# Patient Record
Sex: Male | Born: 1945 | Race: Asian | Hispanic: No
Health system: Midwestern US, Academic
[De-identification: ages and names within clinical notes are randomized; demographics above are authoritative.]

---

## 2016-12-04 ENCOUNTER — Encounter: Admit: 2016-12-04 | Discharge: 2016-12-04 | Payer: MEDICARE

## 2016-12-04 ENCOUNTER — Emergency Department: Admit: 2016-12-04 | Discharge: 2016-12-04 | Disposition: A | Discharge status: Home / Self Care | Payer: MEDICARE

## 2016-12-04 DIAGNOSIS — M7661 Achilles tendinitis, right leg: Principal | ICD-10-CM

## 2016-12-04 DIAGNOSIS — I456 Pre-excitation syndrome: Principal | ICD-10-CM

## 2016-12-04 NOTE — ED Provider Notes
William Sanchez is a 71 y.o. male.    Chief Complaint:  Chief Complaint   Patient presents with   ??? Generalized Joint/Body Pain     tendonitis in right achiles; ruptured left achiles 1989       History of Present Illness:  William Sanchez is a 71 y.o. male, with a history of  Wolff-Parkinson-White (WPW) syndrome who presents to the emergency department with wife at bedside for ankle pain. Pt reports onset 2 days ago with localization around the right Achilles tendon and intermittent recurrence. Pt reports a significant amount of dancing and walking on uneven surfaces in the last few days. Pt notes previous rupture to his left Achilles tendon. Pt has self-treated with ice, massage, and ibuprofen with some relief. Pt is currently taking Losartan. He denies any other rx or recent abx.            Review of Systems:  Review of Systems   Constitutional: Negative for chills and fever.   Respiratory: Negative for shortness of breath.    Cardiovascular: Negative for chest pain and leg swelling.   Gastrointestinal: Negative for abdominal pain and nausea.   Genitourinary: Negative.    Musculoskeletal: Negative for arthralgias, joint swelling and myalgias.        Pain right achilles   Skin: Positive for color change.        Mild redness over affected area   Neurological: Negative for dizziness, weakness and numbness.   Hematological: Does not bruise/bleed easily.       Allergies:  Penicillin g    Past Medical History:  Past Medical History:   Diagnosis Date   ??? Wolff-Parkinson-White (WPW) syndrome        Past Surgical History:  Past Surgical History:   Procedure Laterality Date   ??? ABLATION LESION         Pertinent medical/surgical history reviewed  Past Medical History:   Diagnosis Date   ??? Wolff-Parkinson-White (WPW) syndrome      Past Surgical History:   Procedure Laterality Date   ??? ABLATION LESION         Social History:  Social History   Substance Use Topics   ??? Smoking status: Not on file ??? Smokeless tobacco: Not on file   ??? Alcohol use Not on file     History   Drug use: Unknown       Family History:  No family history on file.    Vitals:  ED Vitals    Date and Time T BP P RR SPO2P SPO2 User   12/04/16 1203 36.4 ???C (97.5 ???F) 157/74 -- 12 PER MINUTE 68 98 % JH          Physical Exam:  Physical Exam   Constitutional: He is oriented to person, place, and time. He appears well-developed and well-nourished.   Eyes: Conjunctivae are normal.   Neck: Normal range of motion.   Cardiovascular: Normal rate, regular rhythm, normal heart sounds and intact distal pulses.    Pulses:       Posterior tibial pulses are 2+ on the right side, and 2+ on the left side.   Pulmonary/Chest: Effort normal and breath sounds normal.   Abdominal: Soft. Bowel sounds are normal. There is no tenderness.   Musculoskeletal: Normal range of motion. He exhibits tenderness (mid right achilles tendon). He exhibits no edema.        Right ankle: He exhibits no swelling.   Negative Thompson right achilles  Neurological: He is alert and oriented to person, place, and time.   RLE neurovascularly intact   Skin: Capillary refill takes less than 2 seconds.   Psychiatric: He has a normal mood and affect.   Nursing note and vitals reviewed.      Laboratory Results:  No results found for this visit on 12/04/16 (from the past 24 hour(s)).   N/A    Radiology Interpretation:    N/A    EKG:    N/A    ED Course:  Physical exam significant for mild erythema, TTP, negative Thompson right achilles.  No labs or imaging indicated  Medications: none  Currently patient is without c/o pain. Pain controlled with Ibuprofen   Discussed outpatient management and follow up with PCP upon returning home to [place].  All questions fully answered. Pt agreeable to plan.   Return to ED precautions given.   Dismissed in stable condition.     MDM  Reviewed: nursing note and vitals        Facility Administered Meds: No current facility-administered medications on file as of 12/04/2016.          Clinical Impression:  Final diagnoses:   Tendonitis, Achilles, right (Primary)       Disposition/Follow up  Discharge          As needed      Medications:  New Prescriptions    No medications on file       Procedure Notes:  Procedures      Attestation / Supervision:  I, Vidal Schwalbe, am scribing for and in the presence of Zenovia Jarred, NP.      Vidal Schwalbe    Attestation / Supervision Note concerning Cathrine Muster: The service was provided by the ARNP alone with immediate availability of a physician in the ED. and I, Zenovia Jarred, APRN-NP, personally performed the services described in this documentation as scribed in my presence and it is both accurate and complete.    Zenovia Jarred, APRN-NP

## 2016-12-04 NOTE — ED Notes
Discharge paperwork provided and explained, pt acknowledged understanding.

## 2016-12-04 NOTE — ED Notes
71yo male presents to the ED with c/o pain in the L achilles tendon area. Pt denies any injury, states "I walked on a lot of uneven ground lately. Pt reports pain when walking, hard to put weight on foot. No obvious deformity noted, pt denies any numbness/tingling. Pt is AOx4, in cart, waiting provider eval.

## 2018-07-05 IMAGING — MR MRI KNEE LT WO CONTRAST
5 series · 40 of 40 positions shown · IV contrast (gadolinium)
Comparison: None

HISTORY: Gout
TECHNIQUE: Multiplanar and multisequence MR imaging of the left knee was performed without the administration of intravenous gadolinium.

[Series 2: t2_axial_fs · axial · 4.0mm · 0.27mm/px · z∈[-92,+33]mm · 8 of 26 slices shown]
[im 1/26]
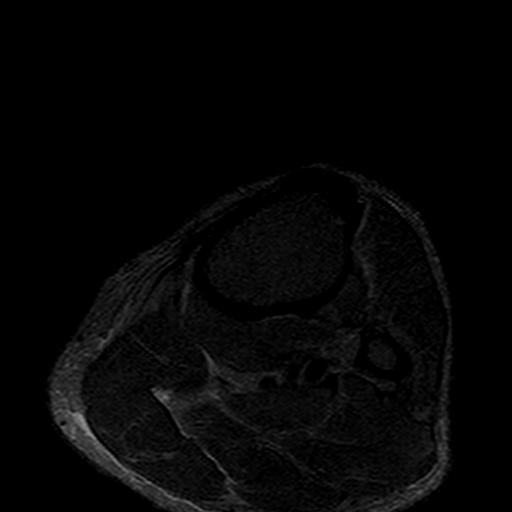
[im 4/26]
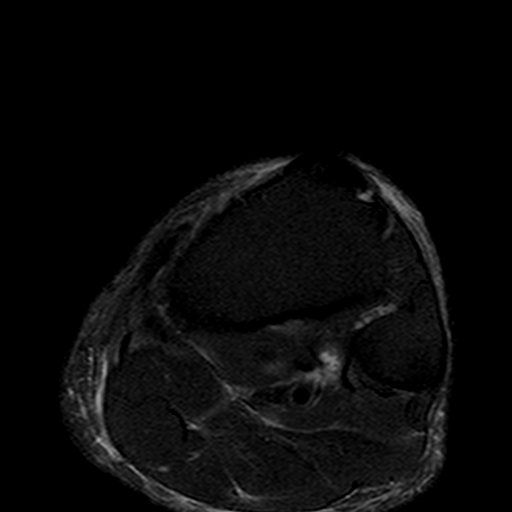
[im 8/26]
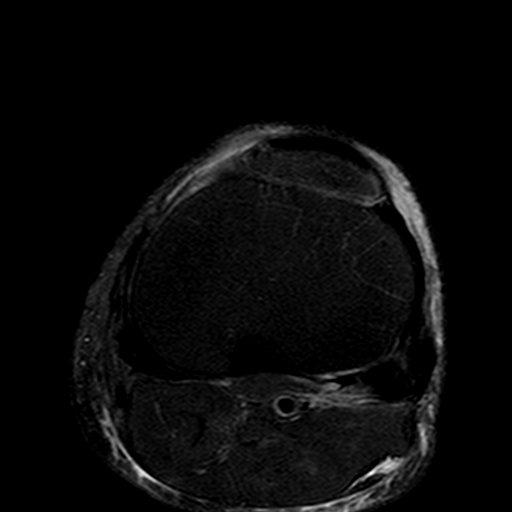
[im 11/26]
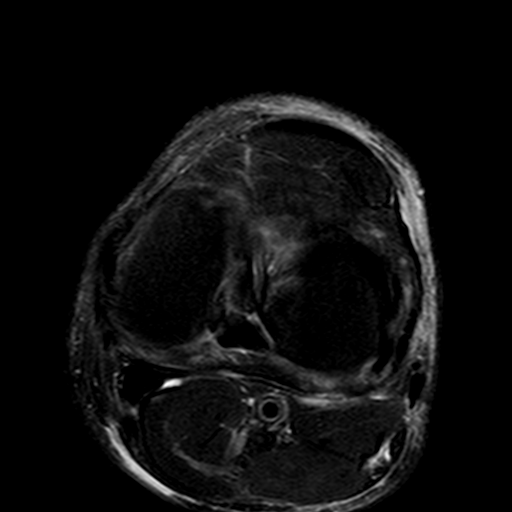
[im 15/26]
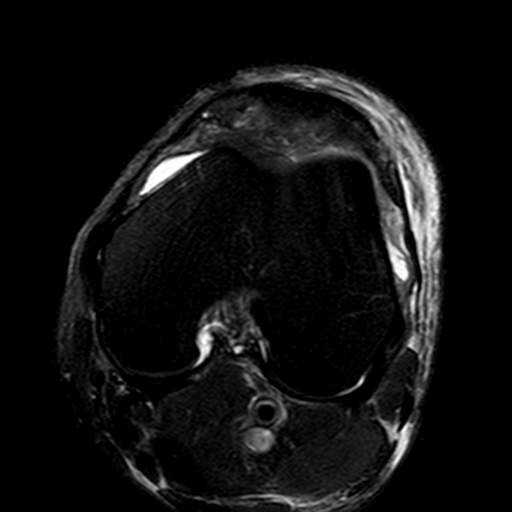
[im 18/26]
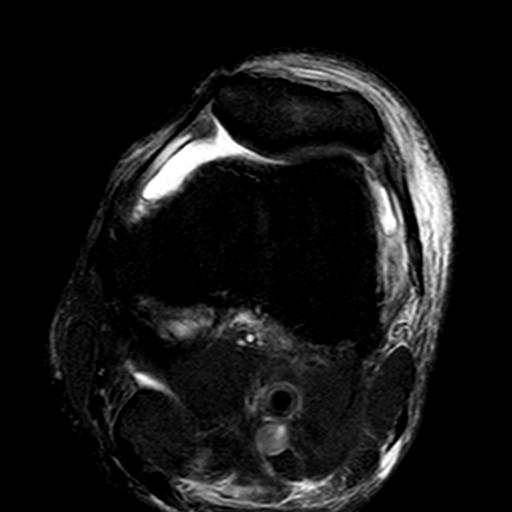
[im 22/26]
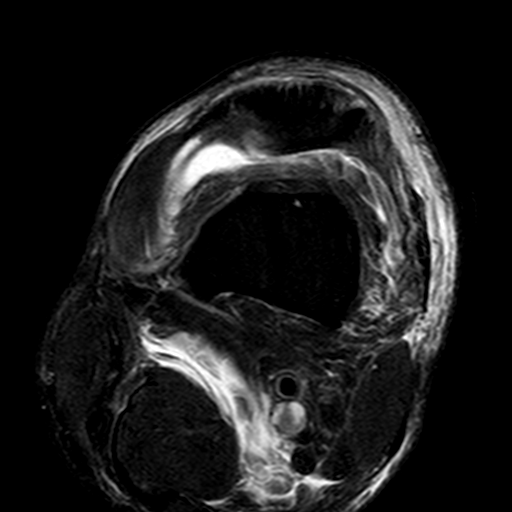
[im 26/26]
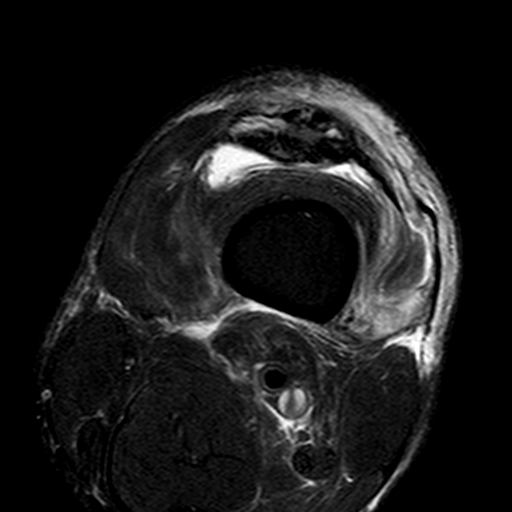

[Series 4: pd_sag_fs · sagittal · 3.0mm · 0.64mm/px · 8 of 28 slices shown]
[im 1/28]
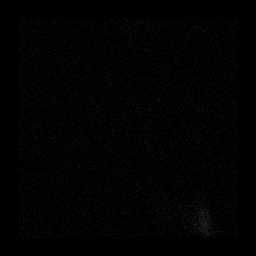
[im 4/28]
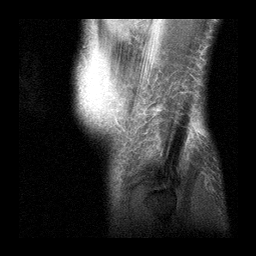
[im 8/28]
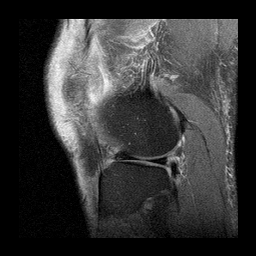
[im 12/28]
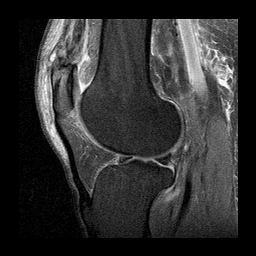
[im 16/28]
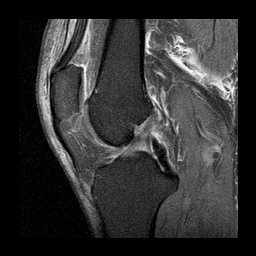
[im 20/28]
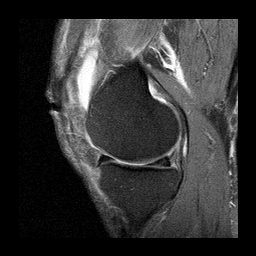
[im 24/28]
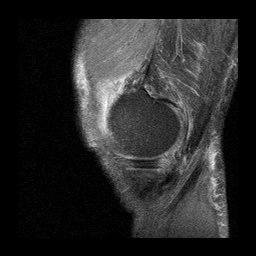
[im 28/28]
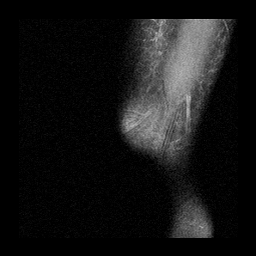

[Series 5: t2_sag_fs · sagittal · 3.0mm · 0.64mm/px · 8 of 28 slices shown]
[im 1/28]
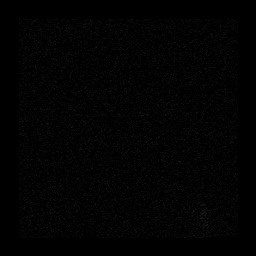
[im 4/28]
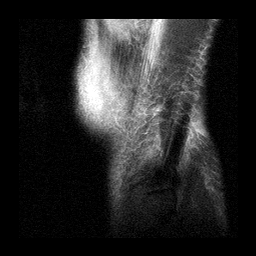
[im 8/28]
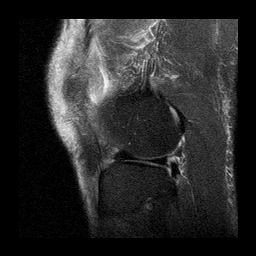
[im 12/28]
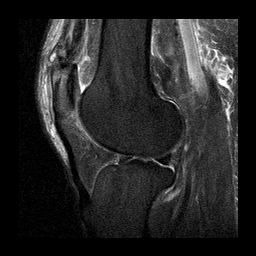
[im 16/28]
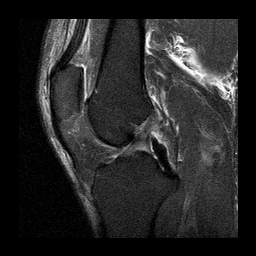
[im 20/28]
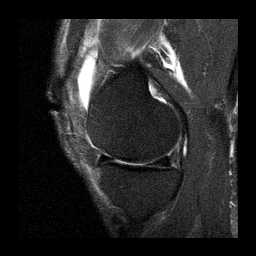
[im 24/28]
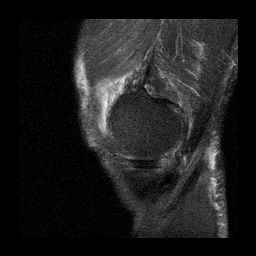
[im 28/28]
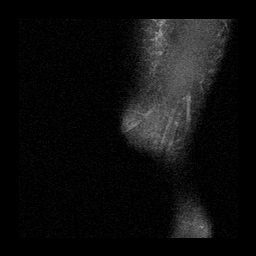

[Series 6: t1_cor · coronal · 4.0mm · 0.42mm/px · 8 of 26 slices shown]
[im 1/26]
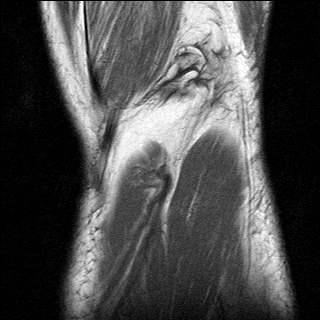
[im 4/26]
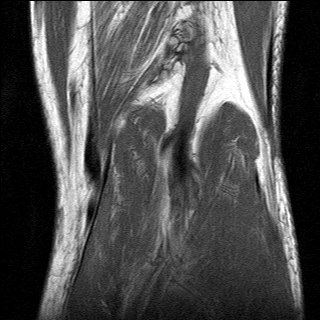
[im 8/26]
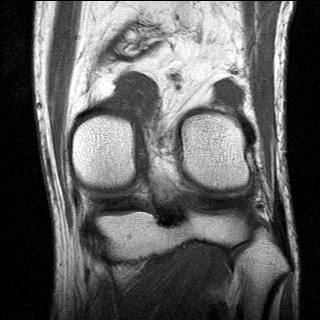
[im 11/26]
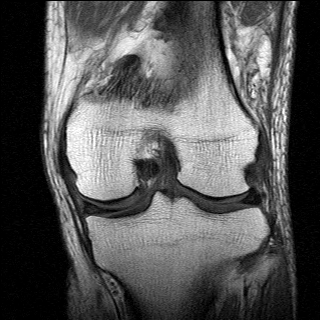
[im 15/26]
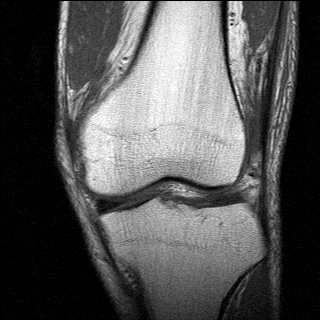
[im 18/26]
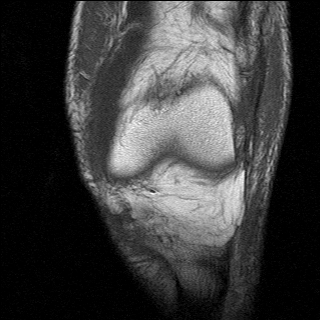
[im 22/26]
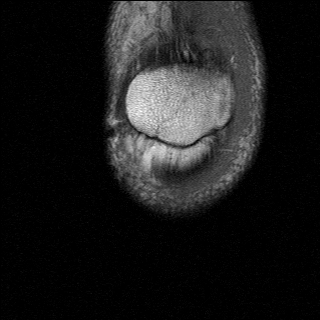
[im 26/26]
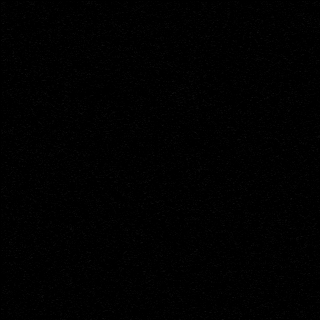

[Series 7: t2_cor_fs · coronal · 4.0mm · 0.53mm/px · 8 of 26 slices shown]
[im 1/26]
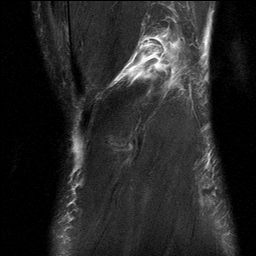
[im 4/26]
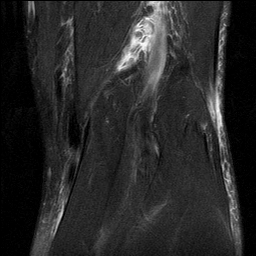
[im 8/26]
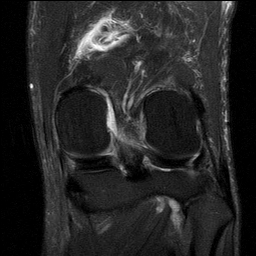
[im 11/26]
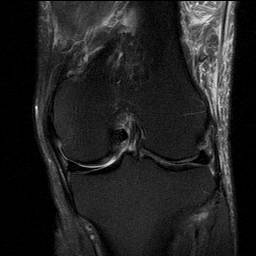
[im 15/26]
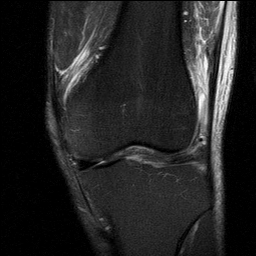
[im 18/26]
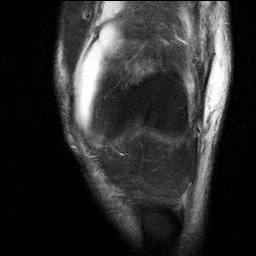
[im 22/26]
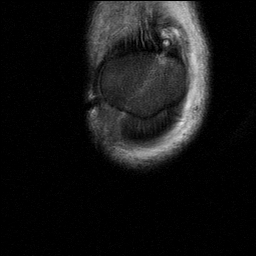
[im 26/26]
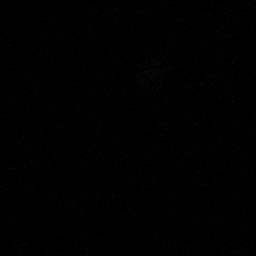

[40 of 40 positions shown; findings below may reference images not displayed]

FINDINGS: The anterior cruciate, posterior cruciate, medial collateral and lateral collateral ligaments are intact without evidence of tear or sprain.

There is an oblique undersurface tear posterior horn medial meniscus. Lateral meniscus demonstrates a tiny undersurface tear along the anterior horn.

Small effusion.

Mild chondral surface irregularities of the medial and lateral compartments. Patellofemoral articular cartilage is normally maintained.

Osseous structures demonstrate no fractures or destructive lesions.

There is edema and a disorganized appearance of fibers of the patellar tendon along its lateral third consistent with tendinosis and superimposed low-grade partial tear. Majority of the tendon remains intact. Patellar tendon unremarkable. Patellar retinacula unremarkable. There is a metallic artifact present anterior soft tissues along the medial aspect of the patella. Subcutaneous adipose tissues anteriorly may relate to a direct blow in this position. Cellulitis cannot be excluded. Edema within the popliteal fossa may relate to recently ruptured Baker's cyst.
IMPRESSION: 1. Quadriceps tendinosis and superimposed low-grade partial tear along its lateral third.

2. Mild chondral surface irregularities of the medial and lateral compartments consistent with mild osteoarthritis.

3. Small effusion.

4. Medial and lateral meniscus tears as described above.

5. Nonspecific edema within subcutaneous adipose tissues anteriorly. This may relate to a direct blow anteriorly or possibly cellulitis. Correlate clinically.
